# Patient Record
Sex: Male | Born: 1980 | Race: White | Hispanic: No | Marital: Single | Smoking: Current every day smoker
Health system: Southern US, Community
[De-identification: ages and names within clinical notes are randomized; demographics above are authoritative.]

## PROBLEM LIST (undated history)

## (undated) DIAGNOSIS — F32A Depression, unspecified: Secondary | ICD-10-CM

## (undated) DIAGNOSIS — B019 Varicella without complication: Secondary | ICD-10-CM

## (undated) DIAGNOSIS — F329 Major depressive disorder, single episode, unspecified: Secondary | ICD-10-CM

## (undated) HISTORY — DX: Depression, unspecified: F32.A

## (undated) HISTORY — DX: Major depressive disorder, single episode, unspecified: F32.9

## (undated) HISTORY — DX: Varicella without complication: B01.9

## (undated) NOTE — Nursing Note (Signed)
 Formatting of this note might be different from the original. Patient has had no unplanned utilization during the TCM time frame. Attended PCP appointment and needs identified.  Patient following established care plan and interventions. Case closed.  Electronically signed by Lorine Mom, RN at 12/02/2021  8:49 AM CDT

---

## 2016-03-26 ENCOUNTER — Ambulatory Visit (INDEPENDENT_AMBULATORY_CARE_PROVIDER_SITE_OTHER): Payer: BC Managed Care – PPO | Admitting: Family

## 2016-03-26 ENCOUNTER — Encounter: Payer: Self-pay | Admitting: Family

## 2016-03-26 VITALS — BP 124/94 | HR 95 | Temp 98.6°F | Resp 16 | Ht 73.0 in | Wt 208.0 lb

## 2016-03-26 DIAGNOSIS — F17210 Nicotine dependence, cigarettes, uncomplicated: Secondary | ICD-10-CM

## 2016-03-26 DIAGNOSIS — F3341 Major depressive disorder, recurrent, in partial remission: Secondary | ICD-10-CM | POA: Diagnosis not present

## 2016-03-26 DIAGNOSIS — F32A Depression, unspecified: Secondary | ICD-10-CM | POA: Insufficient documentation

## 2016-03-26 DIAGNOSIS — F17211 Nicotine dependence, cigarettes, in remission: Secondary | ICD-10-CM | POA: Insufficient documentation

## 2016-03-26 DIAGNOSIS — F329 Major depressive disorder, single episode, unspecified: Secondary | ICD-10-CM | POA: Insufficient documentation

## 2016-03-26 MED ORDER — BUPROPION HCL ER (SR) 150 MG PO TB12: ORAL_TABLET | ORAL | 0 refills | Status: DC

## 2016-03-26 NOTE — Assessment & Plan Note (Signed)
Continues to smoke with previous attempts of without medication/support have been unsuccessful. Great than 3 minutes was spent discussing tobacco cessation and methods including patches, gums, inhalers and medications. Information on smoking cessation provided. Start Wellbutrin. Continue to monitor.

## 2016-03-26 NOTE — Patient Instructions (Signed)
Thank you for choosing [facility].  SUMMARY AND INSTRUCTIONS:  Medication:  Please start taking the Wellbutrin.   Your prescription(s) have been submitted to your pharmacy or been printed and provided for you. Please take as directed and contact our office if you believe you are having problem(s) with the medication(s) or have any questions.   Referrals:  You will receive a call with an appointment with psychology.   Referrals have been made during this visit. You should expect to hear back from our schedulers in about 7-10 days in regards to establishing an appointment with the specialists we discussed.   Follow up:  If your symptoms worsen or fail to improve, please contact our office for further instruction, or in case of emergency go directly to the emergency room at the closest medical facility.    Major Depressive Disorder, Adult Major depressive disorder (MDD) is a mental health condition. MDD often makes you feel sad, hopeless, or helpless. MDD can also cause symptoms in your body. MDD can affect your:  Work.  School.  Relationships.  Other normal activities. MDD can range from mild to very bad. It may occur once (single episode MDD). It can also occur many times (recurrent MDD). The main symptoms of MDD often include:  Feeling sad, depressed, or irritable most of the time.  Loss of interest. MDD symptoms also include:  Sleeping too much or too little.  Eating too much or too little.  A change in your weight.  Feeling tired (fatigue) or having low energy.  Feeling worthless.  Feeling guilty.  Trouble making decisions.  Trouble thinking clearly.  Thoughts of suicide or harming others.  Feeling weak.  Feeling agitated.  Keeping yourself from being around other people (isolation). Follow these instructions at home: Activity  Do these things as told by your doctor:  Go back to your normal activities.  Exercise regularly.  Spend time  outdoors. Alcohol  Talk with your doctor about how alcohol can affect your antidepressant medicines.  Do not drink alcohol. Or, limit how much alcohol you drink.  This means no more than 1 drink a day for nonpregnant women and 2 drinks a day for men. One drink equals one of these:  12 oz of beer.  5 oz of wine.  1 oz of hard liquor. General instructions  Take over-the-counter and prescription medicines only as told by your doctor.  Eat a healthy diet.  Get plenty of sleep.  Find activities that you enjoy. Make time to do them.  Think about joining a support group. Your doctor may be able to suggest a group for you.  Keep all follow-up visits as told by your doctor. This is important. Where to find more information:  The [employer] on Mental Illness:  www.nami.org  U.S[employer] of Mental Health:  http://www.maynard.net/www.nimh.nih.gov  National Suicide Prevention Lifeline:  540-884-77161-(878) 326-0825. This is free, 24-hour help. Contact a doctor if:  Your symptoms get worse.  You have new symptoms. Get help right away if:  You self-harm.  You see, hear, taste, smell, or feel things that are not present (hallucinate). If you ever feel like you may hurt yourself or others, or have thoughts about taking your own life, get help right away. You can go to your nearest emergency department or call:  Your local emergency services (911 in the [place]  A suicide crisis helpline, such as the National Suicide Prevention Lifeline:  (978)572-57221-(878) 326-0825. This is open 24 hours a day. This information is not  intended to replace advice given to you by your health care provider. Make sure you discuss any questions you have with your health care provider. Document Released: 03/12/2015 Document Revised: 12/16/2015 Document Reviewed: 12/16/2015 Elsevier Interactive Patient Education  2017 [employer]

## 2016-03-26 NOTE — Progress Notes (Signed)
Subjective:    Patient ID: Jeremy BootsBrendan Jensen, male    DOB: 12/18/1980, 35 y.o.   MRN: [id number]  Chief Complaint  Patient presents with  . Establish Care    wants to get back on something for depression and to help with smoking, referral to psychiatrist    HPI:  Jeremy Jensen is a 35 y.o. male who  has a past medical history of Chicken pox and Depression. and presents today for an office visit to establish care.  1.) Depression - Previously diagnosed with depression and has been experiencing the associated symptoms of increased depression and hopelessness at times that appears to be cyclical which has been going on for several years. Believes there may be a hyperactive component that he feels very energized during the non-depressive times. Has also had underlying anxiety, although he believes that it may have been related to school. Previous modifying factors include therapy and medication. Has not done any therapy in the past 1.5 years. Has been on Zoloft in the past which he felt like it helped but is not sure to say given other circumstances and life changes. Originally from [place] and originally came to [place] to complete his schooling and is contemplating a return to the [place] area. Describes it as a chronic instability and generally changes things about every 2 years. Denies any suicidal ideations. Sleeping okay.   2.) Tobacco use - Interested in quitting smoking and has previously done "cold [place]key" in the past which has been unsustainable. He has also done the vape pen which he lost and is hoping not to use.     No Known Allergies    No outpatient prescriptions prior to visit.   No facility-administered medications prior to visit.      Past Medical History:  Diagnosis Date  . Chicken pox   . Depression       History reviewed. No pertinent surgical history.    Family History  Problem Relation Age of Onset  . Healthy Mother   . Healthy Father   .  Diabetes Maternal Grandmother   . Alcohol abuse Maternal Grandfather   . Depression Paternal Grandmother   . Heart disease Paternal Grandfather   . Crohn's disease Paternal Grandfather       Social History   Social History  . Marital status: Single    Spouse name: N/A  . Number of children: 0  . Years of education: 5118   Occupational History  . School [employer]    Social History Main Topics  . Smoking status: Current Every Day Smoker    Packs/day: 0.50    Years: 16.00    Types: Cigarettes  . Smokeless tobacco: Never Used  . Alcohol use 4.2 oz/week    7 Cans of beer per week  . Drug use: No  . Sexual activity: Not on file   Other Topics Concern  . Not on file   Social History Narrative   Fun: Cycling, reading, music, plays guitar     Review of Systems  Constitutional: Negative for chills and fever.  Respiratory: Negative for chest tightness and shortness of breath.   Psychiatric/Behavioral: Positive for dysphoric mood. Negative for self-injury, sleep disturbance and suicidal ideas. The patient is not nervous/anxious.        Objective:    BP (!) 124/94 (BP Location: Left Arm, Patient Position: Sitting, Cuff Size: Normal)   Pulse 95   Temp 98.6 F (37 C) (Oral)   Resp 16   Ht  6\' 1"  (1.854 m)   Wt 208 lb (94.3 kg)   SpO2 98%   BMI 27.44 kg/m  Nursing note and vital signs reviewed.  Physical Exam  Constitutional: He is oriented to person, place, and time. He appears well-developed and well-nourished. No distress.  Cardiovascular: Normal rate, regular rhythm, normal heart sounds and intact distal pulses.   Pulmonary/Chest: Effort normal and breath sounds normal.  Neurological: He is alert and oriented to person, place, and time.  Skin: Skin is warm and dry.  Psychiatric: He has a normal mood and affect. His behavior is normal. Judgment and thought content normal.       Assessment & Plan:   Problem List Items Addressed This Visit      Other    Depression - Primary    Symptoms consistent with depression and previously medicated with Zoloft with positive effects. Question his description of almost manic states when he is feeling better with concern for possible underlying Biploar. His depression does appear to be exacerbated by being away from family and known supports. Start Wellbutrin as he is also attempting to quit smoking. Denies suicidal ideations. Referral sent to psychology to restart counseling.      Relevant Medications   buPROPion (WELLBUTRIN SR) 150 MG 12 hr tablet   Other Relevant Orders   Ambulatory referral to Psychology   Nicotine dependence, cigarettes, uncomplicated    Continues to smoke with previous attempts of without medication/support have been unsuccessful. Great than 3 minutes was spent discussing tobacco cessation and methods including patches, gums, inhalers and medications. Information on smoking cessation provided. Start Wellbutrin. Continue to monitor.          I am having Mr. Shirk start on buPROPion.   Follow-up: Return in about 1 month (around 04/26/2016), or if symptoms worsen or fail to improve.  Jeanine Luzalone, Gregory, FNP

## 2016-03-26 NOTE — Assessment & Plan Note (Signed)
Symptoms consistent with depression and previously medicated with Zoloft with positive effects. Question his description of almost manic states when he is feeling better with concern for possible underlying Biploar. His depression does appear to be exacerbated by being away from family and known supports. Start Wellbutrin as he is also attempting to quit smoking. Denies suicidal ideations. Referral sent to psychology to restart counseling.

## 2016-04-17 ENCOUNTER — Telehealth: Payer: Self-pay | Admitting: Family

## 2016-04-17 NOTE — Telephone Encounter (Signed)
-----   Message from Ottis Stainerri L Slaugenhaupt, CCC-SLP sent at 04/17/2016 11:05 AM EST ----- FYI:  Your patient is scheduled to see Dr Laymond PurserPerrin on 05/07/16 Pt is aware.  Thank you for the referral.

## 2016-04-23 ENCOUNTER — Other Ambulatory Visit: Payer: Self-pay | Admitting: Family

## 2016-04-23 DIAGNOSIS — F3341 Major depressive disorder, recurrent, in partial remission: Secondary | ICD-10-CM

## 2016-04-23 MED ORDER — BUPROPION HCL ER (SR) 150 MG PO TB12: 150.0000 mg | ORAL_TABLET | Freq: Two times a day (BID) | ORAL | 0 refills | Status: DC

## 2016-05-01 ENCOUNTER — Ambulatory Visit: Payer: Self-pay | Admitting: Family

## 2016-05-07 ENCOUNTER — Ambulatory Visit: Payer: BC Managed Care – PPO | Admitting: Psychology

## 2016-05-15 ENCOUNTER — Ambulatory Visit: Payer: Self-pay | Admitting: Family

## 2016-05-16 ENCOUNTER — Ambulatory Visit (INDEPENDENT_AMBULATORY_CARE_PROVIDER_SITE_OTHER): Payer: BC Managed Care – PPO | Admitting: Psychology

## 2016-05-16 DIAGNOSIS — F4320 Adjustment disorder, unspecified: Secondary | ICD-10-CM | POA: Diagnosis not present

## 2016-06-02 ENCOUNTER — Ambulatory Visit (INDEPENDENT_AMBULATORY_CARE_PROVIDER_SITE_OTHER)
Admission: RE | Admit: 2016-06-02 | Discharge: 2016-06-02 | Disposition: A | Discharge status: Home / Self Care | Payer: BC Managed Care – PPO | Source: Ambulatory Visit | Attending: Family | Admitting: Family

## 2016-06-02 ENCOUNTER — Encounter: Payer: Self-pay | Admitting: Family

## 2016-06-02 ENCOUNTER — Ambulatory Visit (INDEPENDENT_AMBULATORY_CARE_PROVIDER_SITE_OTHER): Payer: BC Managed Care – PPO | Admitting: Family

## 2016-06-02 DIAGNOSIS — F17211 Nicotine dependence, cigarettes, in remission: Secondary | ICD-10-CM

## 2016-06-02 DIAGNOSIS — S83519A Sprain of anterior cruciate ligament of unspecified knee, initial encounter: Secondary | ICD-10-CM | POA: Insufficient documentation

## 2016-06-02 DIAGNOSIS — S83511A Sprain of anterior cruciate ligament of right knee, initial encounter: Secondary | ICD-10-CM

## 2016-06-02 DIAGNOSIS — F3341 Major depressive disorder, recurrent, in partial remission: Secondary | ICD-10-CM | POA: Diagnosis not present

## 2016-06-02 MED ORDER — BUPROPION HCL ER (SR) 150 MG PO TB12: 150.0000 mg | ORAL_TABLET | Freq: Two times a day (BID) | ORAL | 0 refills | Status: AC

## 2016-06-02 NOTE — Patient Instructions (Addendum)
Thank you for choosing [facility].  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  Recommend MRI for confirmation.  Work on Radio broadcast assistant.  Ice x 20 minutes every 2 hours as needed.    Medication:  Please continue to take your medication as prescribed.   Your prescription(s) have been submitted to your pharmacy or been printed and provided for you. Please take as directed and contact our office if you believe you are having problem(s) with the medication(s) or have any questions.  Follow up:  If your symptoms worsen or fail to improve, please contact our office for further instruction, or in case of emergency go directly to the emergency room at the closest medical facility.    Anterior Cruciate Ligament Tear Rehab Ask your health care provider which exercises are safe for you. Do exercises exactly as told by your health care provider and adjust them as directed. It is normal to feel mild stretching, pulling, tightness, or discomfort as you do these exercises, but you should stop right away if you feel sudden pain or your pain gets worse.Do not begin these exercises until told by your health care provider. Stretching and range of motion exercises These exercises warm up your muscles and joints and improve the movement and flexibility of your knee. These exercises also help to relieve pain and stiffness. Exercise A: Knee flexion, active 1. Lie on your back with both knees straight. If this causes back discomfort, bend your healthy knee so your foot is flat on the floor. 2. Slowly slide your left / right heel back toward your buttocks until you feel a gentle stretch in the front of your knee or thigh. 3. Hold for __________ seconds. 4. Slowly slide your left / right heel back to the starting position. Repeat __________ times. Complete this exercise __________ times a day. Exercise B: Knee extension, sitting 1. Sit with your  left / right heel propped up on a chair, a coffee table, or a footstool. Do not have anything under your knee to support it. 2. Allow your leg muscles to relax, letting gravity straighten out your knee. You should feel a stretch behind your left / right knee. 3. If told by your health care provider, deepen the stretch by placing a __________ lb weight on your thigh, just above your kneecap. 4. Hold this position for __________ seconds. 5. Rest your left / right foot on the floor and allow your muscles to relax after each repetition. Repeat __________ times. Complete this stretch __________ times a day. Strengthening exercises These exercises build strength and endurance in your knee. Endurance is the ability to use your muscles for a long time, even after they get tired. Exercise C: Hamstring curls If told by your health care provider, do this exercise while wearing ankle weights. Begin with __________ weights. Then increase the weight by 1 lb (0.5 kg) increments. Do not wear ankle weights that are more than __________. 1. Lie on your abdomen with your legs straight. You can put a towel above your left / right knee for comfort. 2. Bend your left / right knee up to __________ degrees. Keep your hips flat. 3. Hold this position for __________ seconds. 4. Slowly lower your leg to the starting position. Repeat __________ times. Complete this exercise __________ times a day. Exercise D: Straight leg raises (quadriceps) 1. Lie on your back with your left / right leg extended and your other knee bent. 2.  Tense the muscles in the front of your left / right thigh. You should see your kneecap slide up or see increased dimpling just above your knee. 3. Keep these muscles tight as you raise your leg 4-6 inches (10-15 cm) off the floor. 4. Hold for __________ seconds. 5. Keep these muscles tense as you lower your leg. 6. Relax your muscles slowly and completely after each repetition. Repeat __________  times. Complete this exercise __________ times a day. Exercise E: Bridge (hip extensors) 1. Lie on your back on a firm surface with your knees bent and your feet flat on the floor. 2. Tighten your buttocks muscles and lift your bottom off the floor until your trunk is level with your thighs.  Do not arch your back.  You should feel the muscles working in your buttocks and the back of your thighs. If you do not feel a stretch in those muscles, slide your feet 1-2 inches (2.5-5 cm) farther away from your buttocks. 3. Hold this position for __________ seconds. 4. Slowly lower your hips to the starting position. 5. Let your buttocks muscles relax completely between repetitions. 6. If this exercise is too easy, try doing it with your arms crossed over your chest. Repeat __________ times. Complete this exercise __________ times a day. Exercise F: Straight leg raises (hip abductors) 1. Lie on your side with your left / right leg in the top position. Lie so your head, shoulder, knee, and hip line up. You may bend your bottom knee to help you balance. 2. Lift your top leg 4-6 inches (10-15 cm) while keeping your toes pointed straight ahead. 3. Hold this position for __________ seconds. 4. Slowly lower your leg to the starting position. 5. Let your muscles relax completely after each repetition. Repeat __________ times. Complete this exercise __________ times a day. This information is not intended to replace advice given to you by your health care provider. Make sure you discuss any questions you have with your health care provider. Document Released: 10/30/2004 Document Revised: 12/06/2015 Document Reviewed: 03/17/2015 Elsevier Interactive Patient Education  2017 [employer]

## 2016-06-02 NOTE — Progress Notes (Signed)
Pre visit review using our clinic review tool, if applicable. No additional management support is needed unless otherwise documented below in the visit note. 

## 2016-06-02 NOTE — Progress Notes (Signed)
Subjective:    Patient ID: Jeremy Jensen, male    DOB: 1981-02-29, 36 y.o.   MRN: [id number]  Chief Complaint  Patient presents with  . Knee Pain    Pt stated rt knee is been popping/aching  for 2 years.  . Medication Refill    HPI:  Jeremy Jensen is a 36 y.o. male who  has a past medical history of Chicken pox and Depression. and presents today for an office visit.   1.) Depression / Tobacco cessation - Recently started on Wellbutrin for depression and tobacco cessation. Reports taking the medicaiton as prescribed and denies adverse side effects. Has quit smoking in mid-December and has not smoked since. His depression symptoms are generally well controlled and notes he has situational depression at times. Denies suicidal ideations.   2.) Knee pain - this is a new problem. Associated symptom of popping located in his right knee has been going on for about 1-2 years. Did have a fall while rollar skating about 2 years ago with knee swelling x 3 weeks. Did not pursue treatment at that time.  Primarily occurs with deep squatting. Generally non-tender tender unless he squats on multiple occasions. Denies any modifying factors or attempted treatments.   No Known Allergies    Outpatient Medications Prior to Visit  Medication Sig Dispense Refill  . buPROPion (WELLBUTRIN SR) 150 MG 12 hr tablet Take 1 tablet (150 mg total) by mouth 2 (two) times daily. Follow appt is due must see Tammy Sours for refills 60 tablet 0   No facility-administered medications prior to visit.       No past surgical history on file.    Past Medical History:  Diagnosis Date  . Chicken pox   . Depression       Review of Systems  Constitutional: Negative for chills and fever.  Musculoskeletal:       Positive for knee popping.   Neurological: Negative for weakness and numbness.      Objective:    BP 128/86   Pulse 92   Temp 98.2 F (36.8 C)   Ht 6\' 1"  (1.854 m)   Wt 209 lb (94.8 kg)   SpO2 98%    BMI 27.57 kg/m  Nursing note and vital signs reviewed.  Physical Exam  Constitutional: He is oriented to person, place, and time. He appears well-developed and well-nourished. No distress.  Cardiovascular: Normal rate, regular rhythm, normal heart sounds and intact distal pulses.   Pulmonary/Chest: Effort normal and breath sounds normal.  Musculoskeletal:   Right knee - no obvious deformity, discoloration, or edema. There is no tenderness able to be elicited upon palpation with no crepitus or deformity. Range of motion within normal limits. Strength is normal. Distal pulses and sensation are intact and appropriate. Positive anterior drawer test. Negative valgus/varus with mild discomfort noted during McMurray's.  Neurological: He is alert and oriented to person, place, and time.  Skin: Skin is warm and dry.  Psychiatric: He has a normal mood and affect. His behavior is normal. Judgment and thought content normal.       Assessment & Plan:   Problem List Items Addressed This Visit      Musculoskeletal and Integument   ACL (anterior cruciate ligament) tear    Symptoms and exam are consistent with tear of the anterior cruciate ligament of the right knee most likely related to a fall experienced during rollerskating approximately 2 years ago. This is chronic and has no other pain or swelling. Treat  conservatively with home exercise therapy and ice as needed. Obtain x-rays to rule out chondral defects. Consider MRI and a potential knee bracing as needed. Follow-up if symptoms worsen or do not improve for physical therapy or imaging.      Relevant Orders   DG Knee Complete 4 Views Right     Other   Depression    Depression symptoms appear stable with current medication regimen and no adverse side effects. Denies suicidal ideation. Does have occasional situational depression. Continue current dosage of Wellbutrin. Continue to monitor.      Relevant Medications   buPROPion (WELLBUTRIN SR)  150 MG 12 hr tablet   Nicotine dependence, cigarettes, in remission    Recently quit smoking approximately 2 months ago and has been tobacco free with no complications or adverse side effects. Continue current dosage of Wellbutrin.          I am having Mr. Jaskiewicz maintain his buPROPion.   Meds ordered this encounter  Medications  . buPROPion (WELLBUTRIN SR) 150 MG 12 hr tablet    Sig: Take 1 tablet (150 mg total) by mouth 2 (two) times daily. Follow appt is due must see Tammy SoursGreg for refills    Dispense:  180 tablet    Refill:  0    Order Specific Question:   Supervising Provider    Answer:   Hillard DankerRAWFORD, ELIZABETH A [[id number]]     Follow-up: Return in about 1 month (around 06/30/2016), or if symptoms worsen or fail to improve.  Jeanine Luzalone, Gregory, FNP

## 2016-06-02 NOTE — Assessment & Plan Note (Signed)
Symptoms and exam are consistent with tear of the anterior cruciate ligament of the right knee most likely related to a fall experienced during rollerskating approximately 2 years ago. This is chronic and has no other pain or swelling. Treat conservatively with home exercise therapy and ice as needed. Obtain x-rays to rule out chondral defects. Consider MRI and a potential knee bracing as needed. Follow-up if symptoms worsen or do not improve for physical therapy or imaging.

## 2016-06-02 NOTE — Assessment & Plan Note (Signed)
Depression symptoms appear stable with current medication regimen and no adverse side effects. Denies suicidal ideation. Does have occasional situational depression. Continue current dosage of Wellbutrin. Continue to monitor.

## 2016-06-02 NOTE — Assessment & Plan Note (Signed)
Recently quit smoking approximately 2 months ago and has been tobacco free with no complications or adverse side effects. Continue current dosage of Wellbutrin.

## 2017-11-28 IMAGING — DX DG KNEE COMPLETE 4+V*R*
4 series · 4 of 4 positions shown · non-contrast
Comparison: No recent prior .

CLINICAL DATA: Right knee popping with exertion.

EXAM:
RIGHT KNEE - COMPLETE 4+ VIEW

[knee ap]
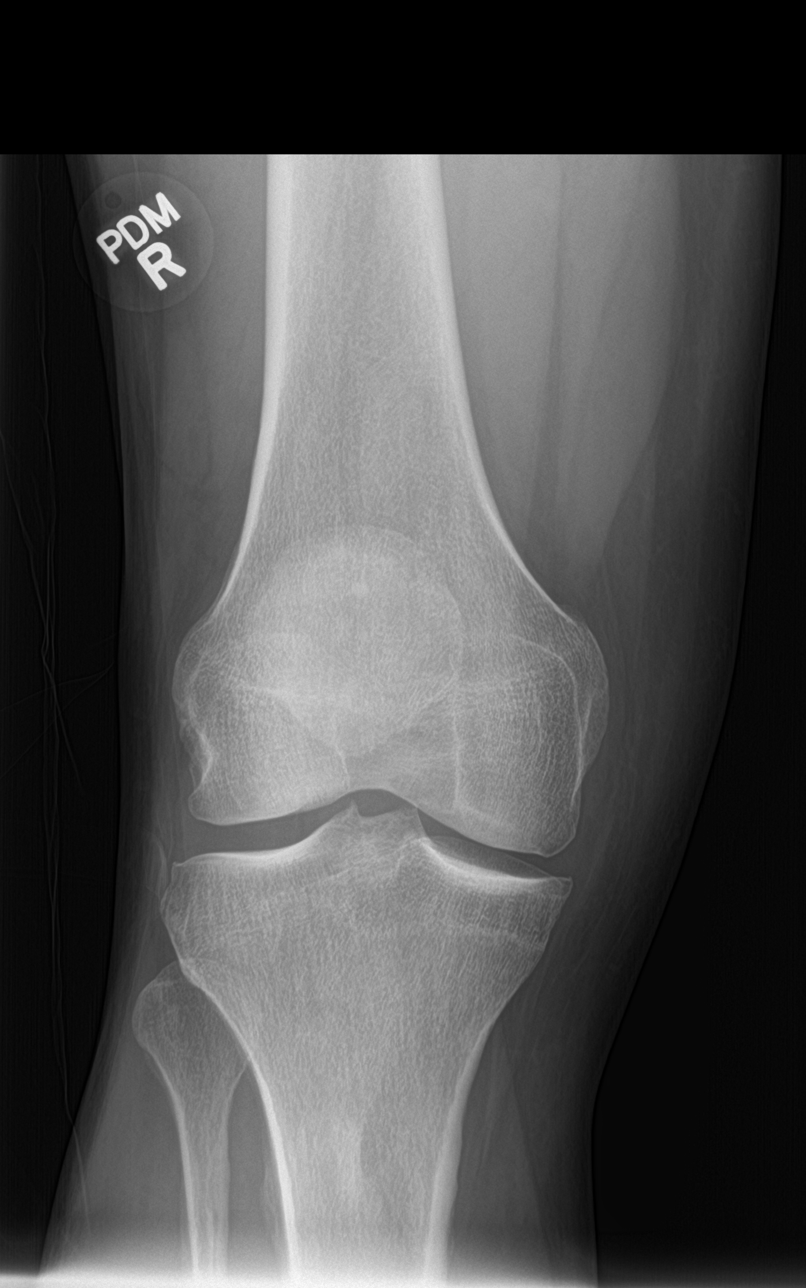

[knee tunnel]
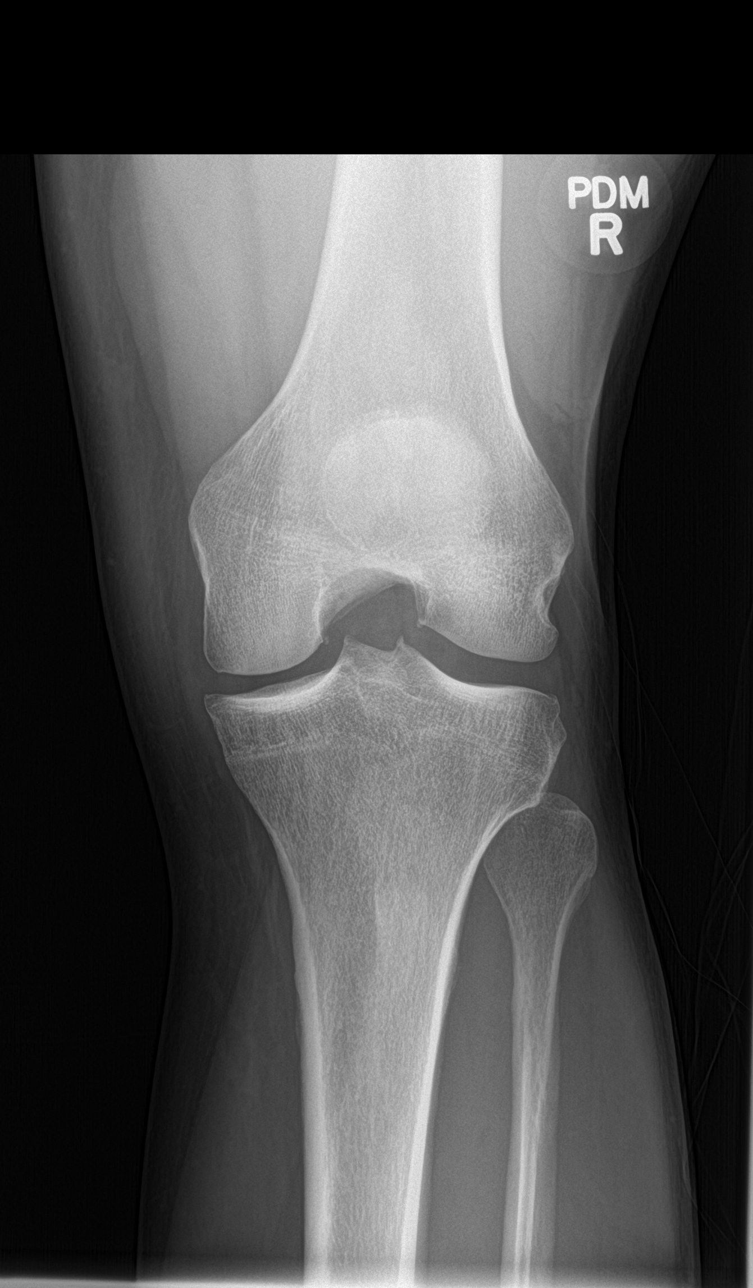

[knee lat]
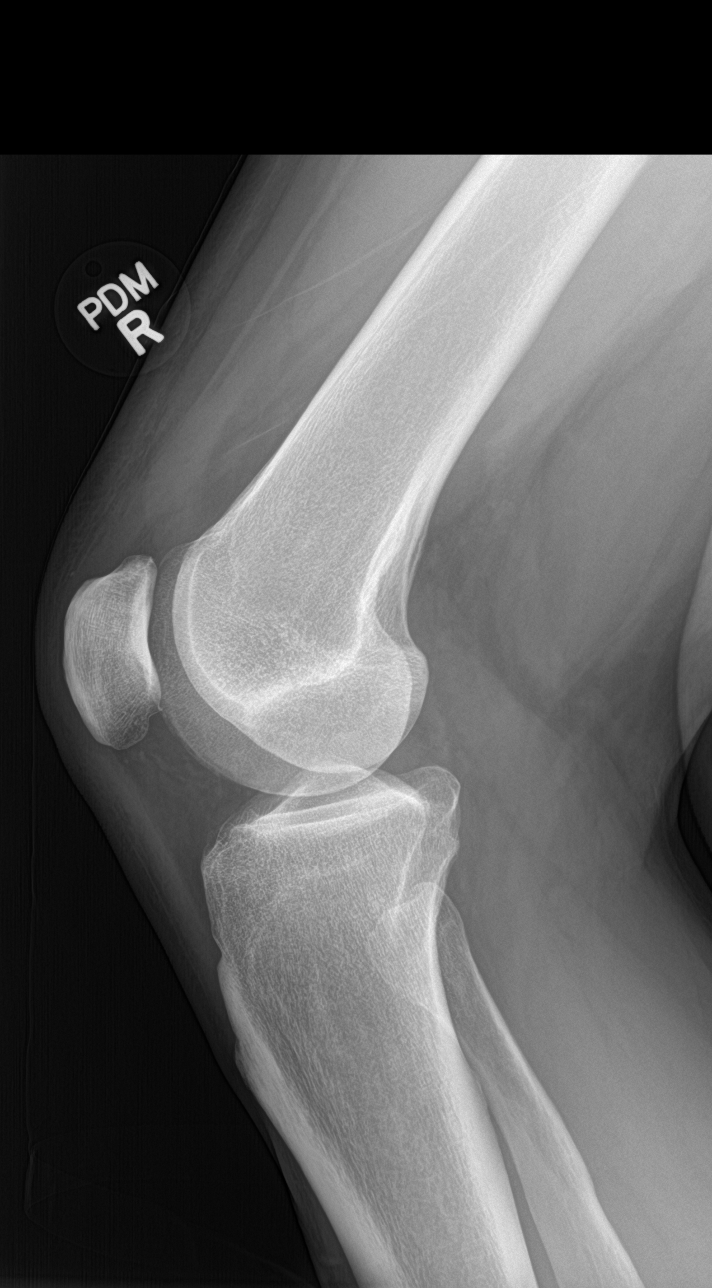

[sunrise]
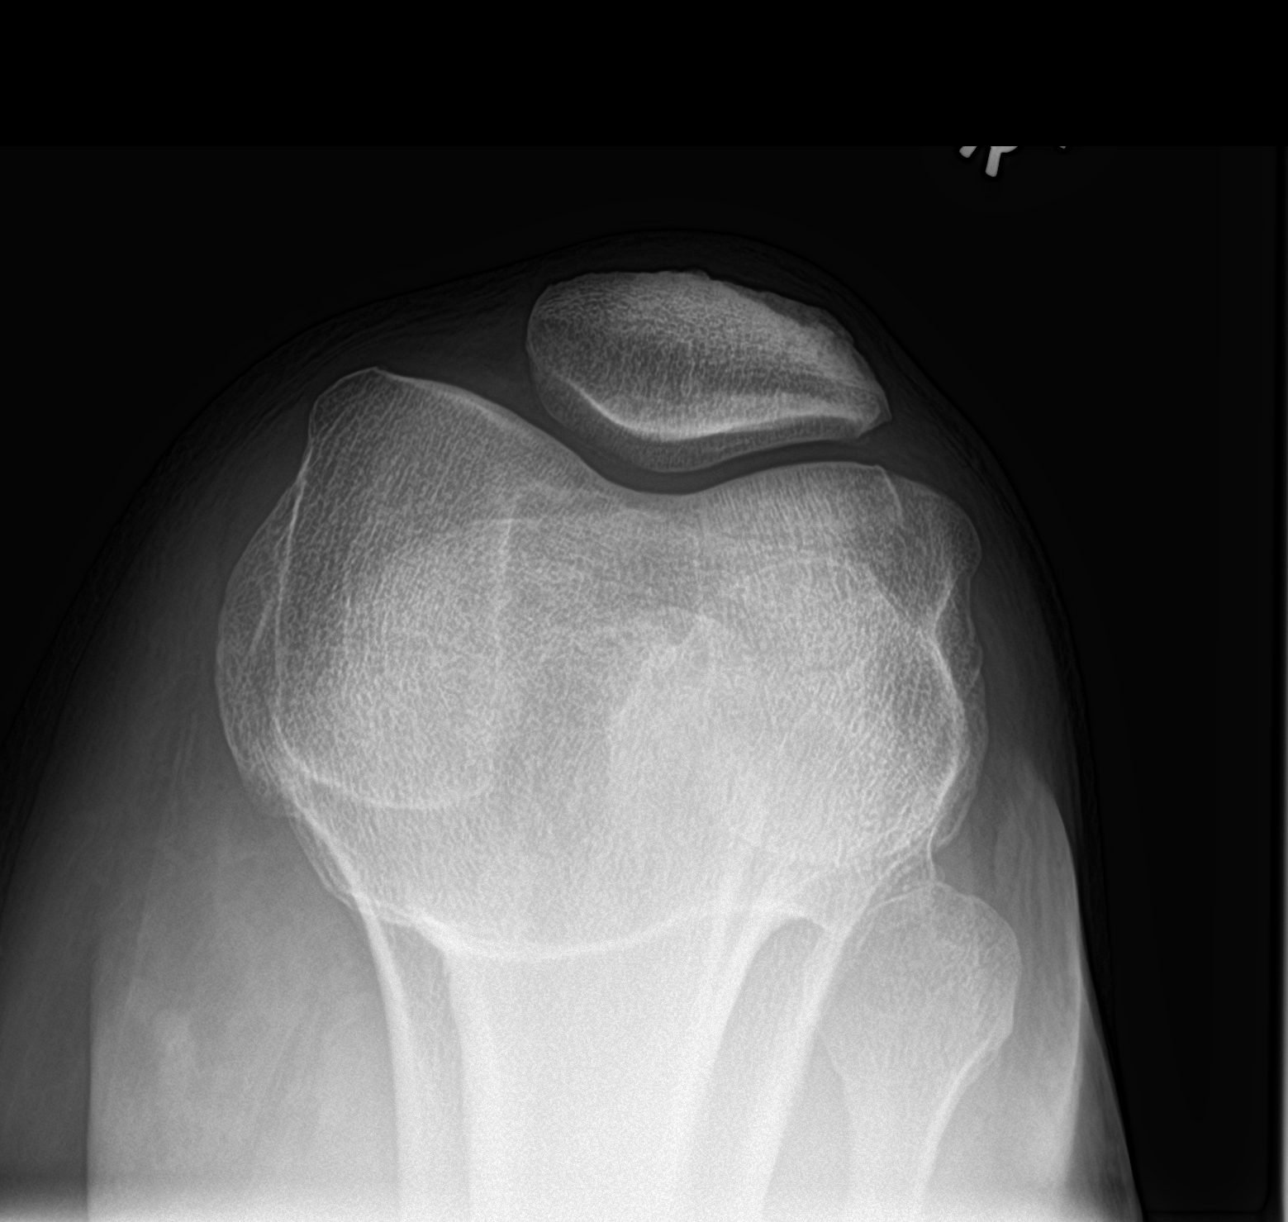

[4 of 4 positions shown; findings below may reference images not displayed]

FINDINGS: Mild degenerative change noted about the medial compartment. Small
loose bodies cannot be excluded. No acute bony or joint abnormality
identified. No fracture or dislocation.
IMPRESSION: Mild degenerative change about the medial compartment. Small loose
bodies cannot be excluded . No acute bony abnormality.
